# Patient Record
Sex: Female | Born: 1980 | Hispanic: Yes | Marital: Married | Smoking: Never smoker
Health system: Southern US, Community
[De-identification: ages and names within clinical notes are randomized; demographics above are authoritative.]

## PROBLEM LIST (undated history)

## (undated) HISTORY — PX: OVARIAN CYST REMOVAL: SHX89

---

## 2011-05-12 ENCOUNTER — Emergency Department: Payer: Self-pay | Admitting: Emergency Medicine

## 2014-01-06 ENCOUNTER — Ambulatory Visit: Payer: Self-pay | Admitting: Family Medicine

## 2014-01-18 ENCOUNTER — Emergency Department: Payer: Self-pay | Admitting: Emergency Medicine

## 2014-05-09 ENCOUNTER — Ambulatory Visit: Payer: Self-pay | Admitting: Family Medicine

## 2015-11-27 ENCOUNTER — Emergency Department
Admission: EM | Admit: 2015-11-27 | Discharge: 2015-11-27 | Disposition: A | Discharge status: Home / Self Care | Payer: Self-pay | Attending: Emergency Medicine | Admitting: Emergency Medicine

## 2015-11-27 ENCOUNTER — Emergency Department: Payer: Self-pay

## 2015-11-27 ENCOUNTER — Encounter: Payer: Self-pay | Admitting: Emergency Medicine

## 2015-11-27 DIAGNOSIS — R1030 Lower abdominal pain, unspecified: Secondary | ICD-10-CM | POA: Insufficient documentation

## 2015-11-27 LAB — COMPREHENSIVE METABOLIC PANEL
ALBUMIN: 4.1 g/dL (ref 3.5–5.0)
ALT: 18 U/L (ref 14–54)
AST: 24 U/L (ref 15–41)
Alkaline Phosphatase: 66 U/L (ref 38–126)
Anion gap: 6 (ref 5–15)
BUN: 9 mg/dL (ref 6–20)
CHLORIDE: 109 mmol/L (ref 101–111)
CO2: 25 mmol/L (ref 22–32)
Calcium: 8.8 mg/dL — ABNORMAL LOW (ref 8.9–10.3)
Creatinine, Ser: 0.51 mg/dL (ref 0.44–1.00)
GFR calc Af Amer: >60 mL/min (ref >60)
GFR calc non Af Amer: >60 mL/min (ref >60)
GLUCOSE: 98 mg/dL (ref 65–99)
POTASSIUM: 3.1 mmol/L — AB (ref 3.5–5.1)
Sodium: 140 mmol/L (ref 135–145)
TOTAL PROTEIN: 7 g/dL (ref 6.5–8.1)
Total Bilirubin: 0.2 mg/dL — ABNORMAL LOW (ref 0.3–1.2)

## 2015-11-27 LAB — CBC WITH DIFFERENTIAL/PLATELET
Basophils Absolute: 0 10*3/uL (ref 0–0.1)
Basophils Relative: 0 %
Eosinophils Absolute: 0.1 10*3/uL (ref 0–0.7)
HCT: 34.4 % — ABNORMAL LOW (ref 35.0–47.0)
Hemoglobin: 12 g/dL (ref 12.0–16.0)
LYMPHS ABS: 1.2 10*3/uL (ref 1.0–3.6)
MCH: 28.8 pg (ref 26.0–34.0)
MCHC: 34.8 g/dL (ref 32.0–36.0)
MCV: 82.8 fL (ref 80.0–100.0)
MONO ABS: 0.2 10*3/uL (ref 0.2–0.9)
Monocytes Relative: 1 %
Neutro Abs: 11 10*3/uL — ABNORMAL HIGH (ref 1.4–6.5)
Neutrophils Relative %: 88 %
PLATELETS: 183 10*3/uL (ref 150–440)
RBC: 4.16 MIL/uL (ref 3.80–5.20)
RDW: 14 % (ref 11.5–14.5)
WBC: 12.5 10*3/uL — ABNORMAL HIGH (ref 3.6–11.0)

## 2015-11-27 LAB — POCT PREGNANCY, URINE: Preg Test, Ur: NEGATIVE

## 2015-11-27 MED ORDER — PANTOPRAZOLE SODIUM 20 MG PO TBEC: 20.0000 mg | DELAYED_RELEASE_TABLET | Freq: Every day | ORAL | Status: AC

## 2015-11-27 MED ORDER — HYDROCODONE-ACETAMINOPHEN 5-325 MG PO TABS
1.0000 | ORAL_TABLET | Freq: Once | ORAL | Status: AC
  Administered 2015-11-27: 1 via ORAL
  Filled 2015-11-27: qty 1

## 2015-11-27 MED ORDER — SODIUM CHLORIDE 0.9 % IV BOLUS (SEPSIS)
500.0000 mL | Freq: Once | INTRAVENOUS | Status: AC
  Administered 2015-11-27: 500 mL via INTRAVENOUS

## 2015-11-27 MED ORDER — POTASSIUM CHLORIDE CRYS ER 20 MEQ PO TBCR
20.0000 meq | EXTENDED_RELEASE_TABLET | Freq: Once | ORAL | Status: AC
  Administered 2015-11-27: 20 meq via ORAL
  Filled 2015-11-27: qty 1

## 2015-11-27 NOTE — ED Notes (Signed)
Interpreter requested 

## 2015-11-27 NOTE — ED Notes (Addendum)
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  Patient also had  some blood drawn. After receiving the vaccine patient began feeling chills and abdominal pain.  Patient also had some vomiting and noticed some blood in the stomach.  Pain is in lower abdomen.

## 2015-11-27 NOTE — ED Notes (Signed)
Patient transported to CT 

## 2015-11-27 NOTE — Discharge Instructions (Signed)
Dolor abdominal en adultos (Abdominal Pain, Adult) El dolor de estmago (abdominal) puede tener muchas causas. La mayora de las veces, el dolor de [place] no es peligroso. Muchos de Franklin Resourcesestos casos de dolor de estmago pueden controlarse y tratarse en casa. CUIDADOS EN EL HOGAR   No tome medicamentos que lo ayuden a defecar (laxantes), salvo que su mdico se lo indique.  Solo tome los medicamentos que le haya indicado su mdico.  Coma o beba lo que le indique su mdico. Su mdico le dir si debe seguir una dieta especial. SOLICITE AYUDA SI:  No sabe cul es la causa del dolor de [place].  Tiene dolor de estmago cuando siente ganas de vomitar (nuseas) o tiene colitis (diarrea).  Tiene dolor durante la miccin o la evacuacin.  El dolor de estmago lo despierta de noche.  Tiene dolor de [place] que empeora o [place] cuando come.  Tiene dolor de Mir[place]mago que empeora cuando come CIGNAalimentos grasosos.  Tiene fiebre. SOLICITE AYUDA DE INMEDIATO SI:   El dolor no desaparece en un plazo mximo de 2horas.  No deja de (vomitar).  El dolor cambia y se Librarian, academiclocaliza solo en la parte derecha o izquierda del [place].  La materia fecal es sanguinolenta o de aspecto alquitranado. ASEGRESE DE QUE:   Comprende estas instrucciones.  Controlar su afeccin.  Recibir ayuda de inmediato si no mejora o si empeora.   Esta informacin no tiene Theme park managercomo fin reemplazar el consejo del mdico. Asegrese de hacerle al mdico cualquier pregunta que tenga.   Document Released: 08/16/2008 Document Revised: 06/10/2014 Elsevier Interactive Patient Education [employer]2016 Elsevier Inc.  Please return immediately if condition worsens. Please contact her primary physician or the physician you were given for referral. If you have any specialist physicians involved in her treatment and plan please also contact them. Thank you for using [facility].

## 2015-11-27 NOTE — ED Provider Notes (Signed)
Time Seen: Approximately 1240  I have reviewed the triage notes  Chief Complaint: Abdominal Pain   History of Present Illness: Ebony Arnold is a 35 y.o. female who [facility]. She receives IV antibiotic treatment. Patient states she felt fine prior to the intervention except she had some lower middle quadrant abdominal pain. She states after she received the IV antibiotics she became nauseated and vomited 1 with only trace blood. She describes pain in the lower part of her abdomen without any epigastric pain. She states she only vomited that one time and now does not feel any significant nausea. Patient denies any fever and still has some lower middle quadrant abdominal discomfort. She denies any back or flank pain. Patient denies any fever prior to arrival.  Patient's Spanish-speaking only and history, review of systems and disposition was acquired through [place]  interpreter History reviewed. No pertinent past medical history.  There are no active problems to display for this patient.   Past Surgical History  Procedure Laterality Date  . Ovarian cyst removal      Past Surgical History  Procedure Laterality Date  . Ovarian cyst removal      Current Outpatient Rx  Name  Route  Sig  Dispense  Refill  . pantoprazole (PROTONIX) 20 MG tablet   Oral   Take 1 tablet (20 mg total) by mouth daily.   30 tablet   1     Allergies:  Review of patient's allergies indicates no known allergies.  Family History: No family history on file.  Social History: Social History  Substance Use Topics  . Smoking status: Never Smoker   . Smokeless tobacco: None  . Alcohol Use: No     Review of Systems:   10 point review of systems was performed and was otherwise negative:  Constitutional: No fever Eyes: No visual disturbances ENT: No sore throat, ear pain Cardiac: No chest pain Respiratory: No shortness of breath,  wheezing, or stridor Abdomen: Patient denies any loose stool or diarrhea. No persistent vomiting Endocrine: No weight loss, No night sweats Extremities: No peripheral edema, cyanosis Skin: No rashes, easy bruising Neurologic: No focal weakness, trouble with speech or swollowing Urologic: No dysuria, Hematuria, or urinary frequency   Physical Exam:  ED Triage Vitals  Enc Vitals Group     BP 11/27/15 1108 95/45 mmHg     Pulse Rate 11/27/15 1108 93     Resp 11/27/15 1108 22     Temp 11/27/15 1108 98.3 F (36.8 C)     Temp Source 11/27/15 1108 Oral     SpO2 11/27/15 1108 100 %     Weight 11/27/15 1108 160 lb (72.576 kg)     Height 11/27/15 1108 5\' 2"  (1.575 m)     Head Cir --      Peak Flow --      Pain Score 11/27/15 1109 9     Pain Loc --      Pain Edu? --      Excl. in GC? --     General: Awake , Alert , and Oriented times 3; GCS 15 Head: Normal cephalic , atraumatic Eyes: Pupils equal , round, reactive to light Nose/Throat: No nasal drainage, patent upper airway without erythema or exudate.  Neck: Supple, Full range of motion, No anterior adenopathy or palpable thyroid masses Lungs: Clear to ascultation without wheezes , rhonchi, or rales Heart: Regular rate, regular rhythm without murmurs ,  gallops , or rubs Abdomen: Mild tenderness over the lower middle quadrant with without rebound, guarding , or rigidity; bowel sounds positive and symmetric in all 4 quadrants. No organomegaly .        Extremities: 2 plus symmetric pulses. No edema, clubbing or cyanosis Neurologic: normal ambulation, Motor symmetric without deficits, sensory intact Skin: warm, dry, no rashes   Labs:   All laboratory work was reviewed including any pertinent negatives or positives listed below:  Labs Reviewed  URINALYSIS COMPLETEWITH MICROSCOPIC ([facility] ONLY) - Abnormal; Notable for the following:    Color, Urine YELLOW (*)    APPearance CLOUDY (*)    Hgb urine dipstick 3+ (*)    Leukocytes, UA 1+  (*)    All other components within normal limits  COMPREHENSIVE METABOLIC PANEL - Abnormal; Notable for the following:    Potassium 3.1 (*)    Calcium 8.8 (*)    Total Bilirubin 0.2 (*)    All other components within normal limits  CBC WITH DIFFERENTIAL/PLATELET - Abnormal; Notable for the following:    WBC 12.5 (*)    HCT 34.4 (*)    Neutro Abs 11.0 (*)    All other components within normal limits  POC URINE PREG, ED  POCT PREGNANCY, URINE  Patient's white blood cell count is slightly elevated but otherwise laboratory work was within broad limits of normal Radiology:        CT RENAL STONE STUDY (Final result) Result time: 11/27/15 14:00:31   Final result by Rad Results In Interface (11/27/15 14:00:31)   Narrative:   CLINICAL DATA: Abdominal pain and history of TB  EXAM: CT ABDOMEN AND PELVIS WITHOUT CONTRAST  TECHNIQUE: Multidetector CT imaging of the abdomen and pelvis was performed following the standard protocol without IV contrast.  COMPARISON: None.  FINDINGS: Lower chest: No acute findings.  Hepatobiliary: No mass visualized on this un-enhanced exam.  Pancreas: No mass or inflammatory process identified on this un-enhanced exam.  Spleen: Within normal limits in size.  Adrenals/Urinary Tract: The adrenal glands are within normal limits. The kidneys are well visualized bilaterally without obstructive change. Tiny fleck like calcifications are noted bilaterally consistent with nonobstructing stones. No ureteral calculi are noted.  Stomach/Bowel: No evidence of obstruction, inflammatory process, or abnormal fluid collections. The appendix is within normal limits.  Vascular/Lymphatic: No pathologically enlarged lymph nodes. No evidence of abdominal aortic aneurysm.  Reproductive: No mass or other significant abnormality.  Other: None.  Musculoskeletal: No suspicious bone lesions identified.  IMPRESSION: Tiny fleck like nonobstructing renal  calculi bilaterally. No other focal abnormality is seen.   Electronically Signed By: Alcide Clever M.D. On: 11/27/2015 14:00     I personally reviewed the radiologic studies    ED Course: Patient stay here was uneventful and she had symptomatic improvement just with some IV fluids oral Norco and some supplemental oral potassium. I'm not sure the exact cause of her abdominal pain but does not appear to be a surgical issue at this time as the appendix and bowel appears to be within normal limits the patient does not have any pain at present repeat exam was benign. Patient may have some gastritis secondary to the medication but does not appear to have at least an unstable gastrointestinal bleeding at this time and denies any melena by history.    Assessment:  Acute lower middle quadrant abdominal pain unspecified   Final Clinical Impression:  Final diagnoses:  Lower abdominal pain     Plan:  Outpatient Patient  was advised to return immediately if condition worsens. Patient was advised to follow up with their primary care physician or other specialized physicians involved in their outpatient care. The patient and/or family member/power of attorney had laboratory results reviewed at the bedside. All questions and concerns were addressed and appropriate discharge instructions were distributed by the nursing staff. Patient was placed on Protonix for GI irritation on file she was unlikely to have an acute upper gastrointestinal bleed            Jennye MoccasinBrian S Quigley, MD 11/27/15 1836

## 2015-12-01 LAB — URINALYSIS COMPLETE WITH MICROSCOPIC (ARMC ONLY)
BACTERIA UA: NONE SEEN
BILIRUBIN URINE: NEGATIVE
GLUCOSE, UA: NEGATIVE mg/dL
Ketones, ur: NEGATIVE mg/dL
Nitrite: NEGATIVE
PH: 5 (ref 5.0–8.0)
Protein, ur: NEGATIVE mg/dL
Specific Gravity, Urine: 1.014 (ref 1.005–1.030)

## 2016-11-17 IMAGING — CT CT RENAL STONE PROTOCOL
3 of 4 series · 8 of 46 positions shown, 15 images · non-contrast
Comparison: None.

CLINICAL DATA: Abdominal pain and history of TB

EXAM:
CT ABDOMEN AND PELVIS WITHOUT CONTRAST
TECHNIQUE: Multidetector CT imaging of the abdomen and pelvis was performed
following the standard protocol without IV contrast.

[Series 4: lung · axial · 0.69mm/px · z∈[-544,-474]mm · 4 of 24 slices shown, 9 images]
[im 5/24  soft-tissue]
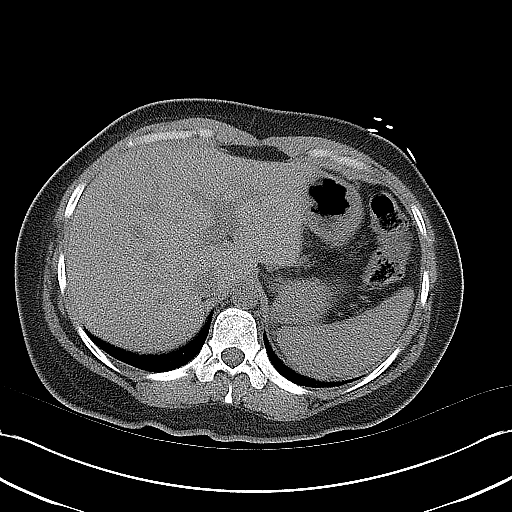
[im 5/24  lung]
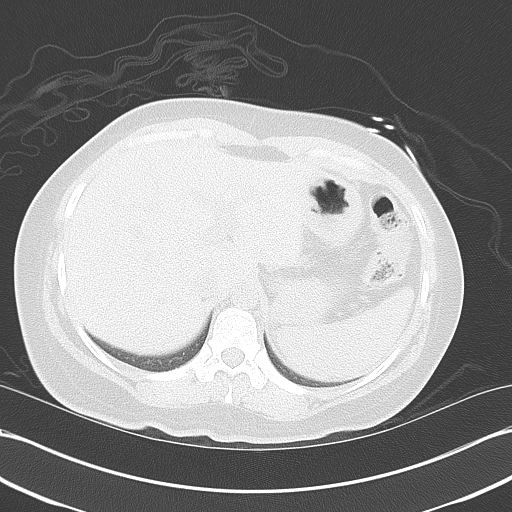
[im 5/24  bone]
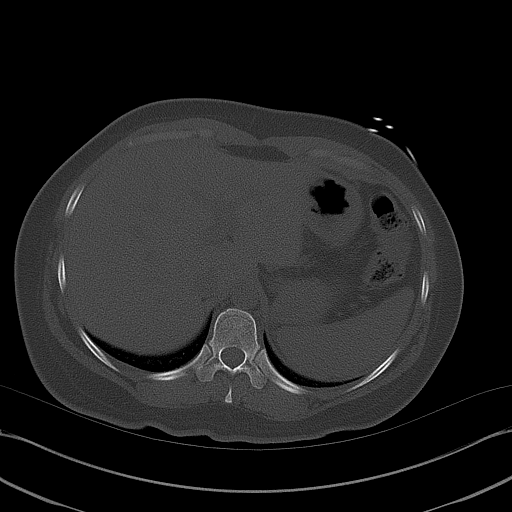
[im 10/24  soft-tissue]
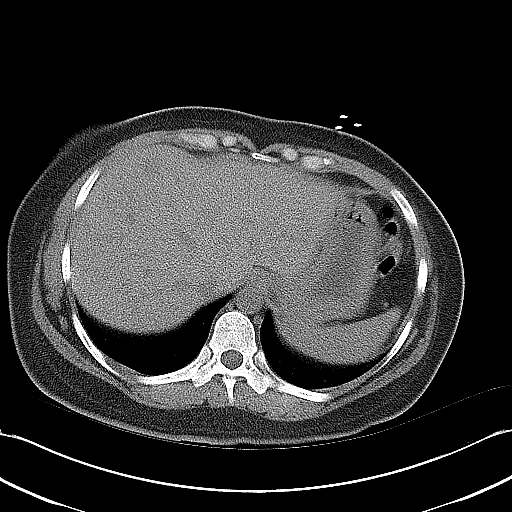
[im 10/24  lung]
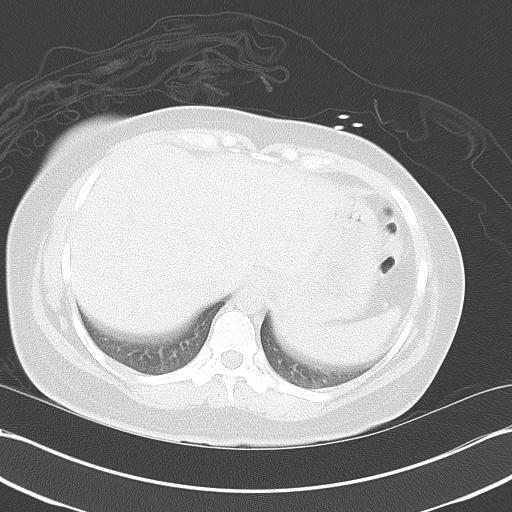
[im 14/24  soft-tissue]
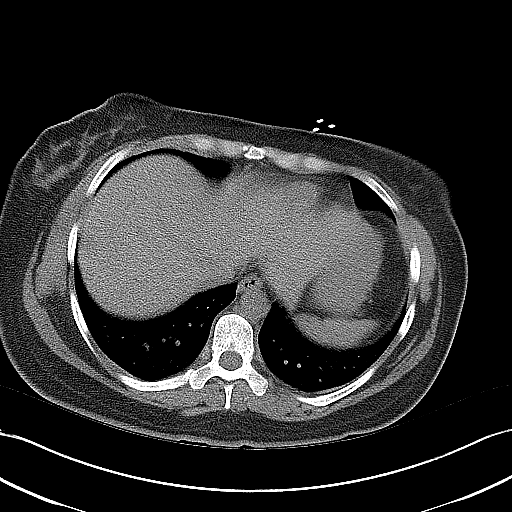
[im 14/24  lung]
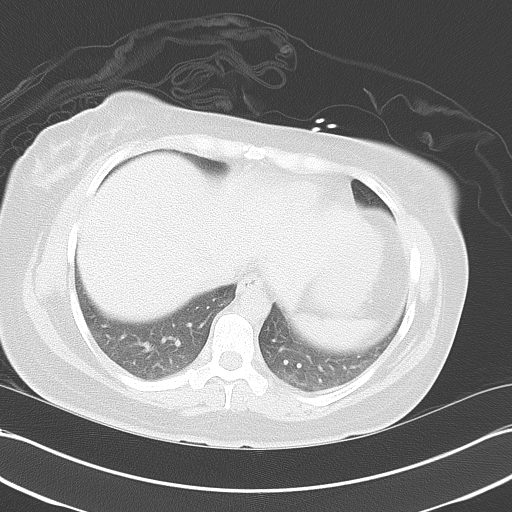
[im 19/24  soft-tissue]
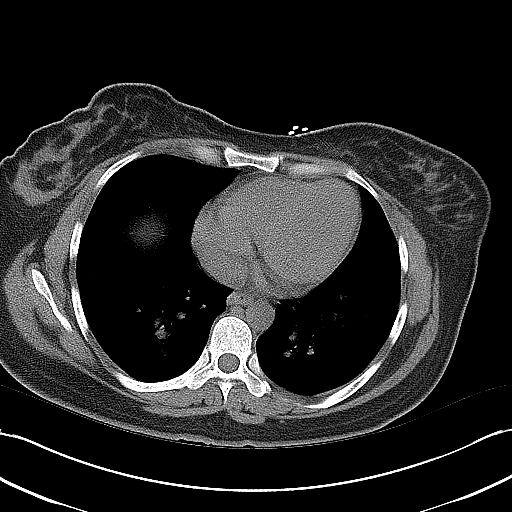
[im 19/24  lung]
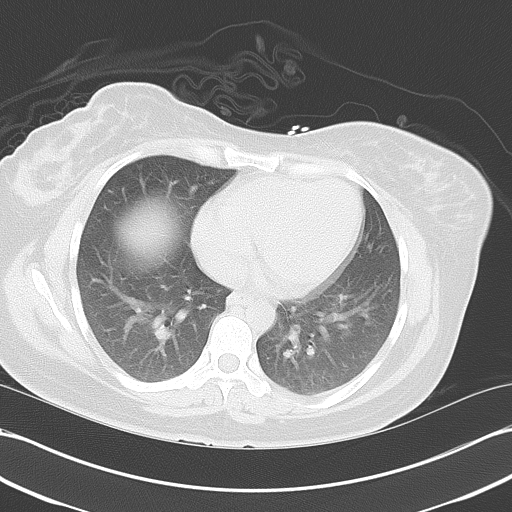

[Series 5: coronal · coronal · 0.70mm/px · 3 of 119 slices shown, 4 images]
[im 40/119  soft-tissue]
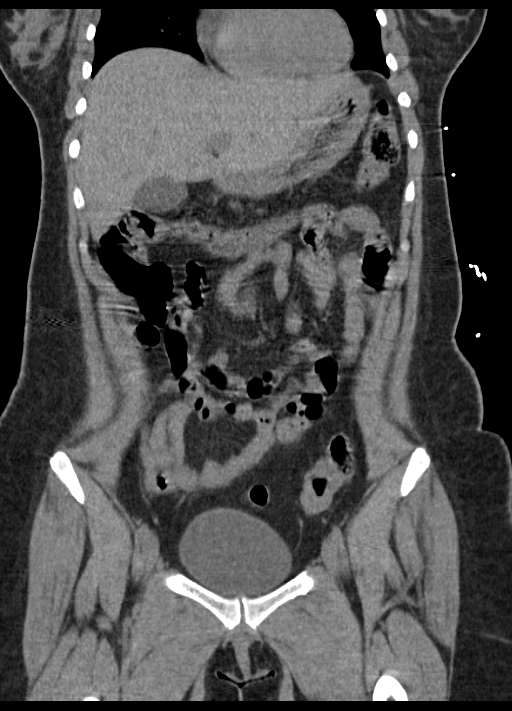
[im 53/119  soft-tissue]
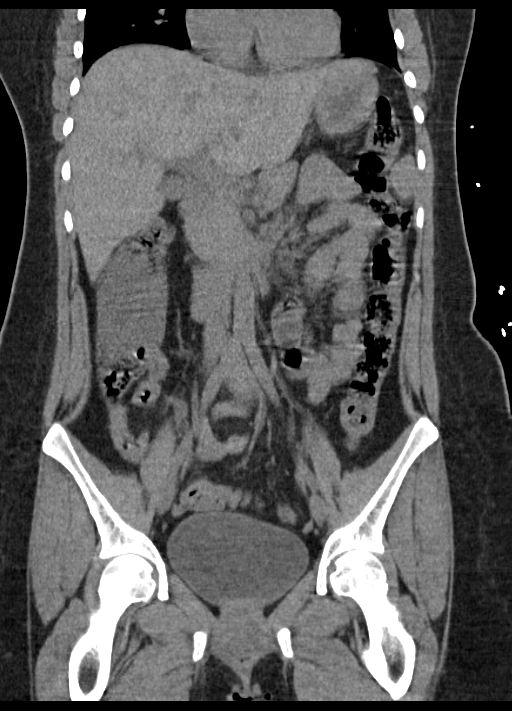
[im 53/119  bone]
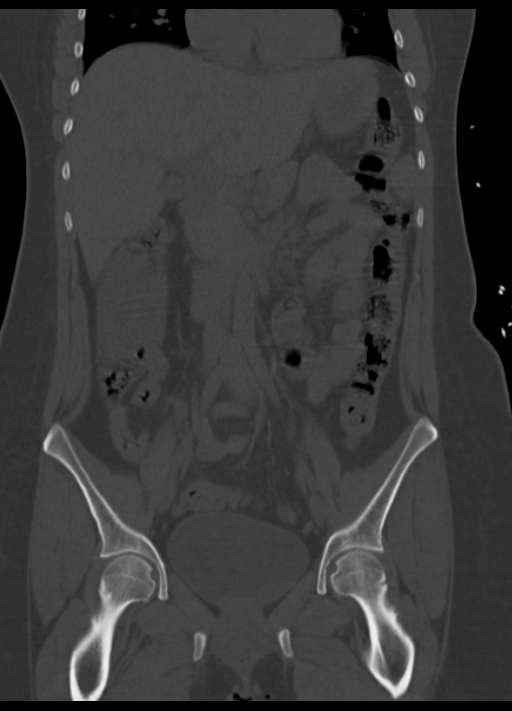
[im 66/119  soft-tissue]
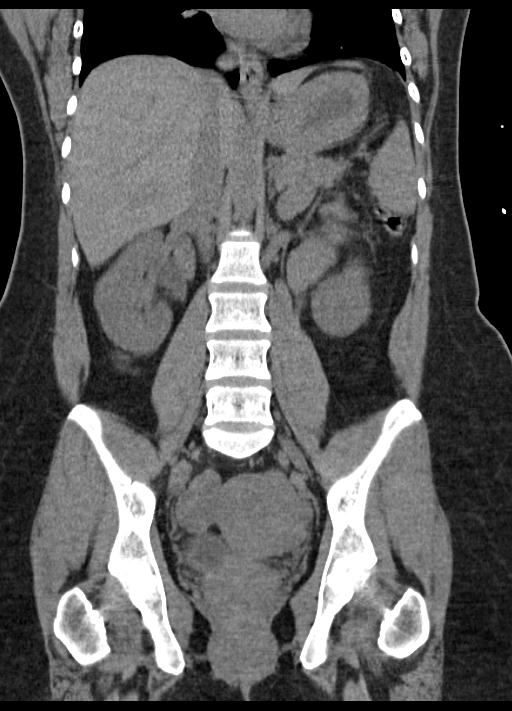

[Series 6: sagittal · sagittal · 0.48mm/px · 1 of 175 slices shown, 2 images]
[im 59/175  soft-tissue]
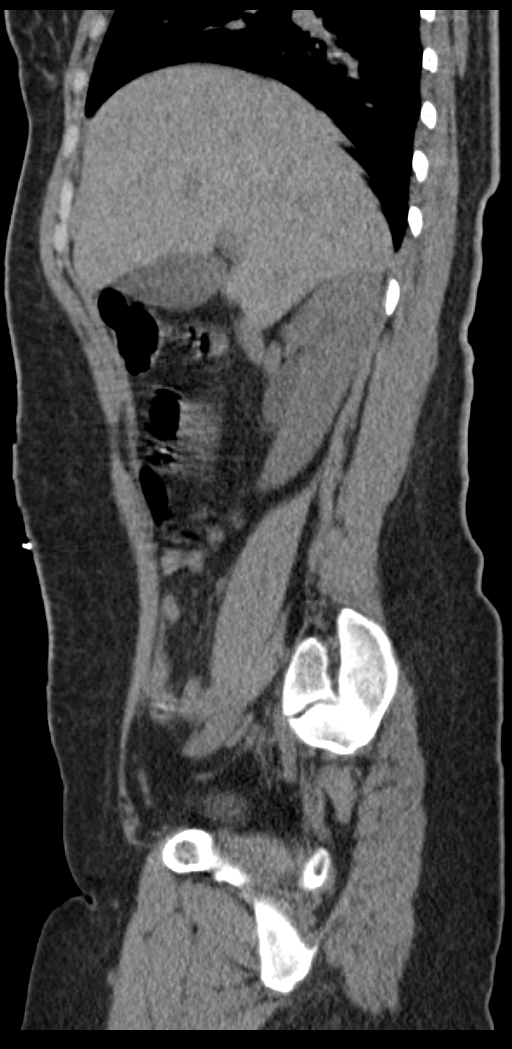
[im 59/175  bone]
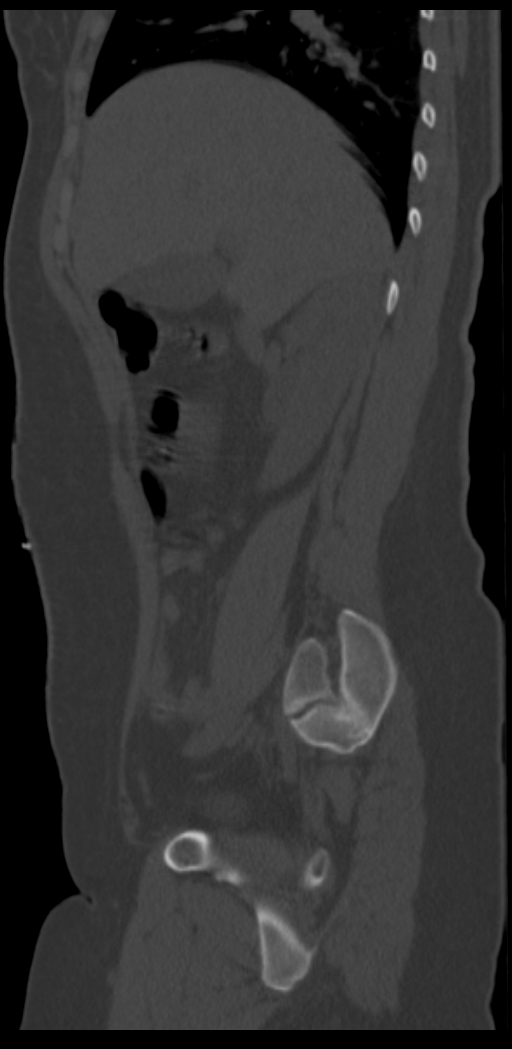

[8 of 46 positions shown; findings below may reference images not displayed]

FINDINGS: Lower chest:  No acute findings.

Hepatobiliary: No mass visualized on this un-enhanced exam.

Pancreas: No mass or inflammatory process identified on this
un-enhanced exam.

Spleen: Within normal limits in size.

Adrenals/Urinary Tract: The adrenal glands are within normal limits.
The kidneys are well visualized bilaterally without obstructive
change. Tiny fleck like calcifications are noted bilaterally
consistent with nonobstructing stones. No ureteral calculi are
noted.

Stomach/Bowel: No evidence of obstruction, inflammatory process, or
abnormal fluid collections. The appendix is within normal limits.

Vascular/Lymphatic: No pathologically enlarged lymph nodes. No
evidence of abdominal aortic aneurysm.

Reproductive: No mass or other significant abnormality.

Other: None.

Musculoskeletal:  No suspicious bone lesions identified.
IMPRESSION: Tiny fleck like nonobstructing renal calculi bilaterally. No other
focal abnormality is seen.
